# Patient Record
Sex: Male | Born: 1990 | Race: White | Hispanic: No | Marital: Single | Smoking: Never smoker
Health system: Southern US, Community
[De-identification: ages and names within clinical notes are randomized; demographics above are authoritative.]

---
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
## 2015-12-09 ENCOUNTER — Emergency Department (HOSPITAL_COMMUNITY): Payer: Worker's Compensation

## 2015-12-09 ENCOUNTER — Inpatient Hospital Stay (HOSPITAL_COMMUNITY)
Admission: EM | Admit: 2015-12-09 | Discharge: 2015-12-13 | DRG: 958 | Disposition: A | Discharge status: Home / Self Care | Payer: Worker's Compensation | Attending: General Surgery | Admitting: General Surgery

## 2015-12-09 ENCOUNTER — Encounter (HOSPITAL_COMMUNITY): Payer: Self-pay | Admitting: Radiology

## 2015-12-09 ENCOUNTER — Emergency Department (HOSPITAL_COMMUNITY): Payer: Worker's Compensation | Admitting: Anesthesiology

## 2015-12-09 ENCOUNTER — Encounter (HOSPITAL_COMMUNITY): Admission: EM | Disposition: A | Discharge status: Home / Self Care | Payer: Self-pay | Source: Home / Self Care

## 2015-12-09 DIAGNOSIS — T1490XA Injury, unspecified, initial encounter: Secondary | ICD-10-CM

## 2015-12-09 DIAGNOSIS — D62 Acute posthemorrhagic anemia: Secondary | ICD-10-CM | POA: Diagnosis not present

## 2015-12-09 DIAGNOSIS — S45102A Unspecified injury of brachial artery, left side, initial encounter: Principal | ICD-10-CM

## 2015-12-09 DIAGNOSIS — N179 Acute kidney failure, unspecified: Secondary | ICD-10-CM | POA: Diagnosis not present

## 2015-12-09 DIAGNOSIS — W3400XA Accidental discharge from unspecified firearms or gun, initial encounter: Secondary | ICD-10-CM

## 2015-12-09 DIAGNOSIS — S4412XA Injury of median nerve at upper arm level, left arm, initial encounter: Secondary | ICD-10-CM | POA: Diagnosis present

## 2015-12-09 DIAGNOSIS — S45192A Other specified injury of brachial artery, left side, initial encounter: Secondary | ICD-10-CM

## 2015-12-09 DIAGNOSIS — S32302A Unspecified fracture of left ilium, initial encounter for closed fracture: Secondary | ICD-10-CM | POA: Diagnosis present

## 2015-12-09 DIAGNOSIS — R200 Anesthesia of skin: Secondary | ICD-10-CM | POA: Diagnosis present

## 2015-12-09 DIAGNOSIS — S5412XA Injury of median nerve at forearm level, left arm, initial encounter: Secondary | ICD-10-CM | POA: Diagnosis present

## 2015-12-09 DIAGNOSIS — S45392A Other specified injury of superficial vein at shoulder and upper arm level, left arm, initial encounter: Secondary | ICD-10-CM | POA: Diagnosis not present

## 2015-12-09 HISTORY — PX: NERVE EXPLORATION: SHX2082

## 2015-12-09 HISTORY — PX: BYPASS AXILLA/BRACHIAL ARTERY: SHX6426

## 2015-12-09 LAB — COMPREHENSIVE METABOLIC PANEL
ALBUMIN: 4.1 g/dL (ref 3.5–5.0)
ALT: 37 U/L (ref 17–63)
ANION GAP: 17 — AB (ref 5–15)
AST: 52 U/L — AB (ref 15–41)
Alkaline Phosphatase: 68 U/L (ref 38–126)
BUN: 25 mg/dL — AB (ref 6–20)
CHLORIDE: 103 mmol/L (ref 101–111)
CO2: 20 mmol/L — ABNORMAL LOW (ref 22–32)
Calcium: 8.9 mg/dL (ref 8.9–10.3)
Creatinine, Ser: 1.66 mg/dL — ABNORMAL HIGH (ref 0.61–1.24)
GFR calc Af Amer: >60 mL/min (ref >60)
GFR, EST NON AFRICAN AMERICAN: 56 mL/min — AB (ref >60)
Glucose, Bld: 139 mg/dL — ABNORMAL HIGH (ref 65–99)
POTASSIUM: 3.5 mmol/L (ref 3.5–5.1)
Sodium: 140 mmol/L (ref 135–145)
Total Bilirubin: 0.7 mg/dL (ref 0.3–1.2)
Total Protein: 6.3 g/dL — ABNORMAL LOW (ref 6.5–8.1)

## 2015-12-09 LAB — PREPARE FRESH FROZEN PLASMA
UNIT DIVISION: 0
Unit division: 0

## 2015-12-09 LAB — CBC
HEMATOCRIT: 43.5 % (ref 39.0–52.0)
HEMOGLOBIN: 15.4 g/dL (ref 13.0–17.0)
MCH: 30.7 pg (ref 26.0–34.0)
MCHC: 35.4 g/dL (ref 30.0–36.0)
MCV: 86.7 fL (ref 78.0–100.0)
Platelets: 278 10*3/uL (ref 150–400)
RBC: 5.02 MIL/uL (ref 4.22–5.81)
RDW: 12.6 % (ref 11.5–15.5)
WBC: 11.1 10*3/uL — AB (ref 4.0–10.5)

## 2015-12-09 LAB — ETHANOL: Alcohol, Ethyl (B): <5 mg/dL (ref <5)

## 2015-12-09 LAB — PROTIME-INR
INR: 1.19 (ref 0.00–1.49)
PROTHROMBIN TIME: 15.3 s — AB (ref 11.6–15.2)

## 2015-12-09 LAB — CDS SEROLOGY

## 2015-12-09 LAB — ABO/RH: ABO/RH(D): A POS

## 2015-12-09 SURGERY — CREATION, BYPASS, ARTERIAL, AXILLARY TO BRACHIAL
Anesthesia: General | Site: Arm Upper | Laterality: Left

## 2015-12-09 MED ORDER — ROCURONIUM BROMIDE 100 MG/10ML IV SOLN
INTRAVENOUS | Status: DC | PRN
  Administered 2015-12-09: 30 mg via INTRAVENOUS
  Administered 2015-12-09: 20 mg via INTRAVENOUS
  Administered 2015-12-09: 10 mg via INTRAVENOUS
  Administered 2015-12-09: 20 mg via INTRAVENOUS
  Administered 2015-12-09: 10 mg via INTRAVENOUS
  Administered 2015-12-09: 30 mg via INTRAVENOUS
  Administered 2015-12-09: 20 mg via INTRAVENOUS
  Administered 2015-12-09: 10 mg via INTRAVENOUS

## 2015-12-09 MED ORDER — PROTAMINE SULFATE 10 MG/ML IV SOLN
INTRAVENOUS | Status: DC | PRN
  Administered 2015-12-09: 50 mg via INTRAVENOUS

## 2015-12-09 MED ORDER — SUCCINYLCHOLINE CHLORIDE 20 MG/ML IJ SOLN
INTRAMUSCULAR | Status: DC | PRN
Start: 2015-12-09 — End: 2015-12-09
  Administered 2015-12-09: 120 mg via INTRAVENOUS

## 2015-12-09 MED ORDER — SODIUM CHLORIDE 0.9 % IR SOLN
Status: DC | PRN
  Administered 2015-12-09: 1000 mL

## 2015-12-09 MED ORDER — FENTANYL CITRATE (PF) 250 MCG/5ML IJ SOLN
INTRAMUSCULAR | Status: AC
  Filled 2015-12-09: qty 5

## 2015-12-09 MED ORDER — SODIUM CHLORIDE 0.9 % IV SOLN
INTRAVENOUS | Status: DC | PRN
  Administered 2015-12-09: 21:00:00

## 2015-12-09 MED ORDER — IOHEXOL 300 MG/ML  SOLN
100.0000 mL | Freq: Once | INTRAMUSCULAR | Status: AC | PRN
Start: 2015-12-09 — End: 2015-12-09
  Administered 2015-12-09: 100 mL via INTRAVENOUS

## 2015-12-09 MED ORDER — LIDOCAINE HCL (CARDIAC) 20 MG/ML IV SOLN
INTRAVENOUS | Status: DC | PRN
  Administered 2015-12-09: 100 mg via INTRAVENOUS

## 2015-12-09 MED ORDER — DEXTROSE 5 % IV SOLN: 1.5000 g | Freq: Two times a day (BID) | INTRAVENOUS | Status: DC

## 2015-12-09 MED ORDER — HYDROMORPHONE HCL 1 MG/ML IJ SOLN
INTRAMUSCULAR | Status: AC
  Filled 2015-12-09: qty 1

## 2015-12-09 MED ORDER — MIDAZOLAM HCL 5 MG/5ML IJ SOLN
INTRAMUSCULAR | Status: DC | PRN
  Administered 2015-12-09: 2 mg via INTRAVENOUS

## 2015-12-09 MED ORDER — HYDROMORPHONE HCL 1 MG/ML IJ SOLN
0.2500 mg | INTRAMUSCULAR | Status: DC | PRN
  Administered 2015-12-09 – 2015-12-10 (×4): 0.5 mg via INTRAVENOUS

## 2015-12-09 MED ORDER — SUGAMMADEX SODIUM 200 MG/2ML IV SOLN
INTRAVENOUS | Status: DC | PRN
  Administered 2015-12-09: 200 mg via INTRAVENOUS

## 2015-12-09 MED ORDER — DEXAMETHASONE SODIUM PHOSPHATE 10 MG/ML IJ SOLN
INTRAMUSCULAR | Status: DC | PRN
  Administered 2015-12-09: 10 mg via INTRAVENOUS

## 2015-12-09 MED ORDER — PROPOFOL 10 MG/ML IV BOLUS
INTRAVENOUS | Status: AC
  Filled 2015-12-09: qty 20

## 2015-12-09 MED ORDER — SUCCINYLCHOLINE CHLORIDE 20 MG/ML IJ SOLN
INTRAMUSCULAR | Status: AC
  Filled 2015-12-09: qty 1

## 2015-12-09 MED ORDER — DEXAMETHASONE SODIUM PHOSPHATE 10 MG/ML IJ SOLN
INTRAMUSCULAR | Status: AC
  Filled 2015-12-09: qty 1

## 2015-12-09 MED ORDER — FENTANYL CITRATE (PF) 100 MCG/2ML IJ SOLN
INTRAMUSCULAR | Status: DC | PRN
  Administered 2015-12-09: 100 ug via INTRAVENOUS
  Administered 2015-12-09: 150 ug via INTRAVENOUS
  Administered 2015-12-09: 50 ug via INTRAVENOUS
  Administered 2015-12-09: 150 ug via INTRAVENOUS

## 2015-12-09 MED ORDER — SODIUM CHLORIDE 0.9 % IV SOLN
INTRAVENOUS | Status: DC
  Administered 2015-12-09: via INTRAVENOUS

## 2015-12-09 MED ORDER — CEFAZOLIN SODIUM 1-5 GM-% IV SOLN
1.0000 g | Freq: Three times a day (TID) | INTRAVENOUS | Status: DC
  Administered 2015-12-10: 1 g via INTRAVENOUS
  Filled 2015-12-09 (×2): qty 50

## 2015-12-09 MED ORDER — PHENYLEPHRINE HCL 10 MG/ML IJ SOLN
INTRAMUSCULAR | Status: DC | PRN
  Administered 2015-12-09: 40 ug via INTRAVENOUS

## 2015-12-09 MED ORDER — PHENYLEPHRINE HCL 10 MG/ML IJ SOLN
10.0000 mg | INTRAMUSCULAR | Status: DC | PRN
  Administered 2015-12-09: 10 ug/min via INTRAVENOUS

## 2015-12-09 MED ORDER — PROTAMINE SULFATE 10 MG/ML IV SOLN
INTRAVENOUS | Status: AC
  Filled 2015-12-09: qty 5

## 2015-12-09 MED ORDER — PROPOFOL 10 MG/ML IV BOLUS
INTRAVENOUS | Status: DC | PRN
  Administered 2015-12-09: 60 mg via INTRAVENOUS
  Administered 2015-12-09: 140 mg via INTRAVENOUS

## 2015-12-09 MED ORDER — ROCURONIUM BROMIDE 50 MG/5ML IV SOLN
INTRAVENOUS | Status: AC
  Filled 2015-12-09: qty 1

## 2015-12-09 MED ORDER — ONDANSETRON HCL 4 MG/2ML IJ SOLN: 4.0000 mg | Freq: Once | INTRAMUSCULAR | Status: DC | PRN

## 2015-12-09 MED ORDER — ONDANSETRON HCL 4 MG/2ML IJ SOLN
INTRAMUSCULAR | Status: DC | PRN
  Administered 2015-12-09: 4 mg via INTRAVENOUS

## 2015-12-09 MED ORDER — MIDAZOLAM HCL 2 MG/2ML IJ SOLN
INTRAMUSCULAR | Status: AC
  Filled 2015-12-09: qty 2

## 2015-12-09 MED ORDER — FENTANYL CITRATE (PF) 100 MCG/2ML IJ SOLN
INTRAMUSCULAR | Status: AC | PRN
  Administered 2015-12-09: 75 ug via INTRAVENOUS

## 2015-12-09 MED ORDER — SODIUM CHLORIDE 0.9 % IV SOLN
INTRAVENOUS | Status: AC | PRN
  Administered 2015-12-09: 100 mL/h via INTRAVENOUS

## 2015-12-09 MED ORDER — LACTATED RINGERS IV SOLN
INTRAVENOUS | Status: DC | PRN
  Administered 2015-12-09 (×2): via INTRAVENOUS

## 2015-12-09 MED ORDER — CEFAZOLIN SODIUM-DEXTROSE 2-3 GM-% IV SOLR
INTRAVENOUS | Status: DC | PRN
  Administered 2015-12-09: 2 g via INTRAVENOUS

## 2015-12-09 MED ORDER — CEFAZOLIN SODIUM-DEXTROSE 2-3 GM-% IV SOLR
INTRAVENOUS | Status: AC
  Filled 2015-12-09: qty 50

## 2015-12-09 MED ORDER — ARTIFICIAL TEARS OP OINT
TOPICAL_OINTMENT | OPHTHALMIC | Status: DC | PRN
  Administered 2015-12-09: 1 via OPHTHALMIC

## 2015-12-09 MED ORDER — HEPARIN SODIUM (PORCINE) 1000 UNIT/ML IJ SOLN
INTRAMUSCULAR | Status: DC | PRN
  Administered 2015-12-09: 2000 [IU] via INTRAVENOUS
  Administered 2015-12-09: 9000 [IU] via INTRAVENOUS

## 2015-12-09 MED ORDER — THROMBIN 20000 UNITS EX SOLR
CUTANEOUS | Status: AC
  Filled 2015-12-09: qty 20000

## 2015-12-09 MED ORDER — SUGAMMADEX SODIUM 200 MG/2ML IV SOLN
INTRAVENOUS | Status: AC
  Filled 2015-12-09: qty 2

## 2015-12-09 MED ORDER — HEPARIN SODIUM (PORCINE) 1000 UNIT/ML IJ SOLN
INTRAMUSCULAR | Status: AC
  Filled 2015-12-09: qty 3

## 2015-12-09 MED ORDER — MEPERIDINE HCL 25 MG/ML IJ SOLN: 6.2500 mg | INTRAMUSCULAR | Status: DC | PRN

## 2015-12-09 MED ORDER — SODIUM CHLORIDE 0.9 % IV SOLN
INTRAVENOUS | Status: DC | PRN
  Administered 2015-12-09: 20:00:00 via INTRAVENOUS

## 2015-12-09 MED ORDER — LIDOCAINE HCL (CARDIAC) 20 MG/ML IV SOLN
INTRAVENOUS | Status: AC
  Filled 2015-12-09: qty 5

## 2015-12-09 MED ORDER — HEMOSTATIC AGENTS (NO CHARGE) OPTIME
TOPICAL | Status: DC | PRN
  Administered 2015-12-09: 1 via TOPICAL

## 2015-12-09 SURGICAL SUPPLY — 62 items
BANDAGE ELASTIC 4 VELCRO ST LF (GAUZE/BANDAGES/DRESSINGS) ×3 IMPLANT
BNDG ESMARK 4X9 LF (GAUZE/BANDAGES/DRESSINGS) IMPLANT
BNDG GAUZE ELAST 4 BULKY (GAUZE/BANDAGES/DRESSINGS) ×3 IMPLANT
CANISTER SUCTION 2500CC (MISCELLANEOUS) ×3 IMPLANT
CLIP TI MEDIUM 6 (CLIP) ×3 IMPLANT
CLIP TI WIDE RED SMALL 6 (CLIP) ×3 IMPLANT
COVER PROBE W GEL 5X96 (DRAPES) ×3 IMPLANT
CUFF TOURNIQUET SINGLE 18IN (TOURNIQUET CUFF) IMPLANT
CUFF TOURNIQUET SINGLE 24IN (TOURNIQUET CUFF) IMPLANT
DECANTER SPIKE VIAL GLASS SM (MISCELLANEOUS) IMPLANT
DRAIN WOUND SNY 15 RND (WOUND CARE) ×3 IMPLANT
DRAPE INCISE IOBAN 66X45 STRL (DRAPES) ×3 IMPLANT
DRSG PAD ABDOMINAL 8X10 ST (GAUZE/BANDAGES/DRESSINGS) ×3 IMPLANT
ELECT CAUTERY BLADE 6.4 (BLADE) ×3 IMPLANT
ELECT REM PT RETURN 9FT ADLT (ELECTROSURGICAL) ×3
ELECTRODE REM PT RTRN 9FT ADLT (ELECTROSURGICAL) ×1 IMPLANT
EVACUATOR SILICONE 100CC (DRAIN) ×3 IMPLANT
GAUZE SPONGE 4X4 16PLY XRAY LF (GAUZE/BANDAGES/DRESSINGS) ×6 IMPLANT
GAUZE XEROFORM 5X9 LF (GAUZE/BANDAGES/DRESSINGS) ×3 IMPLANT
GLOVE BIO SURGEON STRL SZ 6.5 (GLOVE) ×4 IMPLANT
GLOVE BIO SURGEON STRL SZ7 (GLOVE) ×9 IMPLANT
GLOVE BIO SURGEON STRL SZ7.5 (GLOVE) ×12 IMPLANT
GLOVE BIO SURGEONS STRL SZ 6.5 (GLOVE) ×2
GLOVE BIOGEL PI IND STRL 6.5 (GLOVE) ×3 IMPLANT
GLOVE BIOGEL PI IND STRL 7.5 (GLOVE) ×6 IMPLANT
GLOVE BIOGEL PI IND STRL 8.5 (GLOVE) ×1 IMPLANT
GLOVE BIOGEL PI INDICATOR 6.5 (GLOVE) ×6
GLOVE BIOGEL PI INDICATOR 7.5 (GLOVE) ×12
GLOVE BIOGEL PI INDICATOR 8.5 (GLOVE) ×2
GLOVE ECLIPSE 7.0 STRL STRAW (GLOVE) ×3 IMPLANT
GLOVE SURG ORTHO 8.0 STRL STRW (GLOVE) ×3 IMPLANT
GLOVE SURG SS PI 7.5 STRL IVOR (GLOVE) ×6 IMPLANT
GOWN STRL REUS W/ TWL LRG LVL3 (GOWN DISPOSABLE) ×5 IMPLANT
GOWN STRL REUS W/TWL LRG LVL3 (GOWN DISPOSABLE) ×10
HEMOSTAT SPONGE AVITENE ULTRA (HEMOSTASIS) ×3 IMPLANT
KIT BASIN OR (CUSTOM PROCEDURE TRAY) ×3 IMPLANT
KIT ROOM TURNOVER OR (KITS) ×3 IMPLANT
LIQUID BAND (GAUZE/BANDAGES/DRESSINGS) ×3 IMPLANT
NS IRRIG 1000ML POUR BTL (IV SOLUTION) ×3 IMPLANT
PACK CV ACCESS (CUSTOM PROCEDURE TRAY) ×3 IMPLANT
PACK UNIVERSAL I (CUSTOM PROCEDURE TRAY) ×3 IMPLANT
PAD ARMBOARD 7.5X6 YLW CONV (MISCELLANEOUS) ×6 IMPLANT
PAD CAST 4YDX4 CTTN HI CHSV (CAST SUPPLIES) IMPLANT
PADDING CAST COTTON 4X4 STRL (CAST SUPPLIES)
SPONGE GAUZE 4X4 12PLY STER LF (GAUZE/BANDAGES/DRESSINGS) ×3 IMPLANT
SPONGE INTESTINAL PEANUT (DISPOSABLE) ×3 IMPLANT
SPONGE LAP 18X18 X RAY DECT (DISPOSABLE) ×3 IMPLANT
SPONGE SURGIFOAM ABS GEL 100 (HEMOSTASIS) IMPLANT
STAPLER VISISTAT 35W (STAPLE) ×3 IMPLANT
SUT ETHILON 6 0 P 1 (SUTURE) ×3 IMPLANT
SUT MNCRL AB 4-0 PS2 18 (SUTURE) ×3 IMPLANT
SUT PROLENE 6 0 BV (SUTURE) ×15 IMPLANT
SUT PROLENE 6 0 C 1 24 (SUTURE) ×3 IMPLANT
SUT PROLENE 7 0 BV 1 (SUTURE) ×3 IMPLANT
SUT VIC AB 2-0 CT1 36 (SUTURE) ×9 IMPLANT
SUT VIC AB 3-0 SH 27 (SUTURE) ×10
SUT VIC AB 3-0 SH 27X BRD (SUTURE) ×5 IMPLANT
TOWEL OR 17X24 6PK STRL BLUE (TOWEL DISPOSABLE) ×3 IMPLANT
TUBE CONNECTING 12'X1/4 (SUCTIONS) ×1
TUBE CONNECTING 12X1/4 (SUCTIONS) ×2 IMPLANT
UNDERPAD 30X30 INCONTINENT (UNDERPADS AND DIAPERS) ×3 IMPLANT
WATER STERILE IRR 1000ML POUR (IV SOLUTION) ×3 IMPLANT
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
## 2015-12-09 NOTE — ED Notes (Signed)
Pt transported to CT with Redmond Baseman, RN, taylor, nt and Dr. Lindie Spruce. Will continue to monitor.

## 2015-12-09 NOTE — Anesthesia Procedure Notes (Signed)
Procedure Name: Intubation Date/Time: 12/09/2015 7:56 PM Performed by: Wray Kearns A Pre-anesthesia Checklist: Patient identified, Timeout performed, Emergency Drugs available, Suction available and Patient being monitored Patient Re-evaluated:Patient Re-evaluated prior to inductionOxygen Delivery Method: Circle system utilized Preoxygenation: Pre-oxygenation with 100% oxygen Intubation Type: IV induction, Cricoid Pressure applied and Rapid sequence Laryngoscope Size: Mac and 4 Grade View: Grade I Tube type: Oral Tube size: 7.5 mm Number of attempts: 1 Airway Equipment and Method: Stylet Placement Confirmation: ETT inserted through vocal cords under direct vision,  breath sounds checked- equal and bilateral and positive ETCO2 Secured at: 23 cm Tube secured with: Tape Dental Injury: Teeth and Oropharynx as per pre-operative assessment
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
## 2015-12-09 NOTE — Transfer of Care (Signed)
Immediate Anesthesia Transfer of Care Note  Patient: Robert Bryan  Procedure(s) Performed: Procedure(s): Repair of Brachial artery with interposition graft, ligate brachial vein and basilic vein.  Explore left thigh. (Left)  EXPLORATION of left medial nerve (Left)  Patient Location: PACU  Anesthesia Type:General  Level of Consciousness: awake, alert  and oriented  Airway & Oxygen Therapy: Patient Spontanous Breathing  Post-op Assessment: Report given to RN and Post -op Vital signs reviewed and stable  Post vital signs: Reviewed and stable  Last Vitals:  Filed Vitals:   12/09/15 1908 12/09/15 2326  BP: 128/72 137/78  Pulse:  102  Temp:  36.4 C  Resp: 17 14    Complications: No apparent anesthesia complications

## 2015-12-09 NOTE — Anesthesia Postprocedure Evaluation (Signed)
Anesthesia Post Note  Patient: Robert Bryan  Procedure(s) Performed: Procedure(s) (LRB): Repair of Brachial artery with interposition graft, ligate brachial vein and basilic vein.  Explore left thigh. (Left)  EXPLORATION of left medial nerve (Left)  Patient location during evaluation: PACU Anesthesia Type: General Level of consciousness: awake and alert Pain management: pain level controlled Vital Signs Assessment: post-procedure vital signs reviewed and stable Respiratory status: spontaneous breathing, nonlabored ventilation, respiratory function stable and patient connected to nasal cannula oxygen Cardiovascular status: blood pressure returned to baseline and stable Postop Assessment: no signs of nausea or vomiting Anesthetic complications: no    Last Vitals:  Filed Vitals:   12/09/15 1908 12/09/15 2326  BP: 128/72 137/78  Pulse:  102  Temp:  36.4 C  Resp: 17 14    Last Pain:  Filed Vitals:   12/09/15 2340  PainSc: 8                  Garrie Elenes DAVID

## 2015-12-09 NOTE — Anesthesia Preprocedure Evaluation (Signed)
Anesthesia Evaluation  Patient identified by MRN, date of birth, ID band Patient awake    Reviewed: Allergy & Precautions, NPO status , Patient's Chart, lab work & pertinent test results  Airway Mallampati: I  TM Distance: >3 FB Neck ROM: Full    Dental   Pulmonary    Pulmonary exam normal        Cardiovascular Normal cardiovascular exam     Neuro/Psych    GI/Hepatic   Endo/Other    Renal/GU      Musculoskeletal   Abdominal   Peds  Hematology   Anesthesia Other Findings   Reproductive/Obstetrics                             Anesthesia Physical Anesthesia Plan  ASA: III and emergent  Anesthesia Plan: General   Post-op Pain Management:    Induction: Intravenous, Rapid sequence and Cricoid pressure planned  Airway Management Planned: Oral ETT  Additional Equipment:   Intra-op Plan:   Post-operative Plan: Extubation in OR  Informed Consent: I have reviewed the patients History and Physical, chart, labs and discussed the procedure including the risks, benefits and alternatives for the proposed anesthesia with the patient or authorized representative who has indicated his/her understanding and acceptance.     Plan Discussed with: CRNA and Surgeon  Anesthesia Plan Comments:         Anesthesia Quick Evaluation
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
## 2015-12-09 NOTE — ED Notes (Signed)
Tourniquet removed by Dr. Lindie Spruce.
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
## 2015-12-10 ENCOUNTER — Encounter (HOSPITAL_COMMUNITY): Payer: Self-pay | Admitting: *Deleted

## 2015-12-10 LAB — BASIC METABOLIC PANEL
Anion gap: 7 (ref 5–15)
BUN: 19 mg/dL (ref 6–20)
CALCIUM: 7.8 mg/dL — AB (ref 8.9–10.3)
CHLORIDE: 106 mmol/L (ref 101–111)
CO2: 23 mmol/L (ref 22–32)
CREATININE: 1.38 mg/dL — AB (ref 0.61–1.24)
GFR calc non Af Amer: >60 mL/min (ref >60)
Glucose, Bld: 195 mg/dL — ABNORMAL HIGH (ref 65–99)
Potassium: 4.6 mmol/L (ref 3.5–5.1)
SODIUM: 136 mmol/L (ref 135–145)

## 2015-12-10 LAB — TYPE AND SCREEN
ABO/RH(D): A POS
ANTIBODY SCREEN: NEGATIVE
Unit division: 0
Unit division: 0

## 2015-12-10 LAB — CBC
HCT: 32.9 % — ABNORMAL LOW (ref 39.0–52.0)
Hemoglobin: 11.5 g/dL — ABNORMAL LOW (ref 13.0–17.0)
MCH: 30.2 pg (ref 26.0–34.0)
MCHC: 35 g/dL (ref 30.0–36.0)
MCV: 86.4 fL (ref 78.0–100.0)
PLATELETS: 211 10*3/uL (ref 150–400)
RBC: 3.81 MIL/uL — ABNORMAL LOW (ref 4.22–5.81)
RDW: 12.7 % (ref 11.5–15.5)
WBC: 13.2 10*3/uL — ABNORMAL HIGH (ref 4.0–10.5)

## 2015-12-10 LAB — PROTIME-INR
INR: 1.33 (ref 0.00–1.49)
INR: 1.26 (ref 0.00–1.49)
PROTHROMBIN TIME: 16.6 s — AB (ref 11.6–15.2)
PROTHROMBIN TIME: 15.9 s — AB (ref 11.6–15.2)

## 2015-12-10 LAB — APTT
APTT: 29 s (ref 24–37)
aPTT: 30 seconds (ref 24–37)

## 2015-12-10 LAB — BLOOD PRODUCT ORDER (VERBAL) VERIFICATION

## 2015-12-10 LAB — MRSA PCR SCREENING: MRSA by PCR: NEGATIVE

## 2015-12-10 MED ORDER — ENOXAPARIN SODIUM 40 MG/0.4ML ~~LOC~~ SOLN: 40.0000 mg | SUBCUTANEOUS | Status: DC

## 2015-12-10 MED ORDER — PHENOL 1.4 % MT LIQD: 1.0000 | OROMUCOSAL | Status: DC | PRN

## 2015-12-10 MED ORDER — PANTOPRAZOLE SODIUM 40 MG PO TBEC
40.0000 mg | DELAYED_RELEASE_TABLET | Freq: Every day | ORAL | Status: DC
  Administered 2015-12-10: 40 mg via ORAL
  Filled 2015-12-10: qty 1

## 2015-12-10 MED ORDER — MORPHINE SULFATE (PF) 2 MG/ML IV SOLN: 2.0000 mg | INTRAVENOUS | Status: DC | PRN

## 2015-12-10 MED ORDER — PANTOPRAZOLE SODIUM 40 MG PO TBEC: 40.0000 mg | DELAYED_RELEASE_TABLET | Freq: Every day | ORAL | Status: DC

## 2015-12-10 MED ORDER — ACETAMINOPHEN 325 MG PO TABS: 650.0000 mg | ORAL_TABLET | ORAL | Status: DC | PRN

## 2015-12-10 MED ORDER — HYDROMORPHONE HCL 1 MG/ML IJ SOLN
1.0000 mg | INTRAMUSCULAR | Status: DC | PRN
  Administered 2015-12-10 (×3): 1 mg via INTRAVENOUS
  Administered 2015-12-10: 2 mg via INTRAVENOUS
  Administered 2015-12-10 (×3): 1 mg via INTRAVENOUS
  Administered 2015-12-11 (×2): 2 mg via INTRAVENOUS
  Filled 2015-12-10 (×2): qty 1
  Filled 2015-12-10 (×5): qty 2
  Filled 2015-12-10 (×2): qty 1

## 2015-12-10 MED ORDER — DOCUSATE SODIUM 100 MG PO CAPS
100.0000 mg | ORAL_CAPSULE | Freq: Every day | ORAL | Status: DC
Start: 2015-12-10 — End: 2015-12-12
  Administered 2015-12-10 – 2015-12-12 (×3): 100 mg via ORAL
  Filled 2015-12-10 (×3): qty 1

## 2015-12-10 MED ORDER — ONDANSETRON HCL 4 MG/2ML IJ SOLN: 4.0000 mg | Freq: Four times a day (QID) | INTRAMUSCULAR | Status: DC | PRN

## 2015-12-10 MED ORDER — ACETAMINOPHEN 325 MG PO TABS
325.0000 mg | ORAL_TABLET | ORAL | Status: DC | PRN
  Administered 2015-12-11 – 2015-12-13 (×5): 650 mg via ORAL
  Filled 2015-12-10 (×5): qty 2

## 2015-12-10 MED ORDER — ENOXAPARIN SODIUM 40 MG/0.4ML ~~LOC~~ SOLN
40.0000 mg | SUBCUTANEOUS | Status: DC
Start: 2015-12-11 — End: 2015-12-11

## 2015-12-10 MED ORDER — OXYCODONE-ACETAMINOPHEN 5-325 MG PO TABS: 1.0000 | ORAL_TABLET | ORAL | Status: DC | PRN

## 2015-12-10 MED ORDER — POTASSIUM CHLORIDE CRYS ER 20 MEQ PO TBCR: 20.0000 meq | EXTENDED_RELEASE_TABLET | Freq: Every day | ORAL | Status: DC | PRN

## 2015-12-10 MED ORDER — BISACODYL 5 MG PO TBEC: 5.0000 mg | DELAYED_RELEASE_TABLET | Freq: Every day | ORAL | Status: DC | PRN

## 2015-12-10 MED ORDER — GUAIFENESIN-DM 100-10 MG/5ML PO SYRP: 15.0000 mL | ORAL_SOLUTION | ORAL | Status: DC | PRN

## 2015-12-10 MED ORDER — METOPROLOL TARTRATE 1 MG/ML IV SOLN: 2.0000 mg | INTRAVENOUS | Status: DC | PRN

## 2015-12-10 MED ORDER — CEPHALEXIN 500 MG PO CAPS
500.0000 mg | ORAL_CAPSULE | Freq: Four times a day (QID) | ORAL | Status: DC
  Administered 2015-12-10 – 2015-12-13 (×13): 500 mg via ORAL
  Filled 2015-12-10 (×13): qty 1

## 2015-12-10 MED ORDER — KCL IN DEXTROSE-NACL 20-5-0.45 MEQ/L-%-% IV SOLN
INTRAVENOUS | Status: DC
  Administered 2015-12-10: 02:00:00 via INTRAVENOUS
  Filled 2015-12-10: qty 1000

## 2015-12-10 MED ORDER — PANTOPRAZOLE SODIUM 40 MG IV SOLR: 40.0000 mg | Freq: Every day | INTRAVENOUS | Status: DC

## 2015-12-10 MED ORDER — OXYCODONE HCL 5 MG PO TABS: 5.0000 mg | ORAL_TABLET | ORAL | Status: DC | PRN

## 2015-12-10 MED ORDER — SODIUM CHLORIDE 0.9 % IV SOLN: 500.0000 mL | Freq: Once | INTRAVENOUS | Status: DC | PRN

## 2015-12-10 MED ORDER — MAGNESIUM SULFATE 2 GM/50ML IV SOLN: 2.0000 g | Freq: Every day | INTRAVENOUS | Status: DC | PRN

## 2015-12-10 MED ORDER — ONDANSETRON HCL 4 MG PO TABS: 4.0000 mg | ORAL_TABLET | Freq: Four times a day (QID) | ORAL | Status: DC | PRN

## 2015-12-10 MED ORDER — SENNOSIDES-DOCUSATE SODIUM 8.6-50 MG PO TABS: 1.0000 | ORAL_TABLET | Freq: Every evening | ORAL | Status: DC | PRN

## 2015-12-10 MED ORDER — OXYCODONE HCL 5 MG PO TABS
10.0000 mg | ORAL_TABLET | ORAL | Status: DC | PRN
  Administered 2015-12-10 – 2015-12-11 (×5): 10 mg via ORAL
  Filled 2015-12-10 (×5): qty 2

## 2015-12-10 MED ORDER — LABETALOL HCL 5 MG/ML IV SOLN
10.0000 mg | INTRAVENOUS | Status: DC | PRN
  Filled 2015-12-10: qty 4

## 2015-12-10 MED ORDER — ALUM & MAG HYDROXIDE-SIMETH 200-200-20 MG/5ML PO SUSP: 15.0000 mL | ORAL | Status: DC | PRN

## 2015-12-10 MED ORDER — ACETAMINOPHEN 650 MG RE SUPP: 325.0000 mg | RECTAL | Status: DC | PRN

## 2015-12-10 MED ORDER — HYDRALAZINE HCL 20 MG/ML IJ SOLN: 5.0000 mg | INTRAMUSCULAR | Status: DC | PRN
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
## 2015-12-10 NOTE — Progress Notes (Signed)
Patient admitted from [facility] at 0040 via bed with RN. Patient alert and oriented x4. Left Upper arm compression wrap with JP drain in place. CMS assessed and patient notes numbness but is able to move LUE and L  Radial pulse palpable. See echarting for additional data.

## 2015-12-10 NOTE — Progress Notes (Signed)
Inpatient Diabetes Program Recommendations  AACE/ADA: New Consensus Statement on Inpatient Glycemic Control (2015)  Target Ranges:  Prepandial:   less than 140 mg/dL      Peak postprandial:   less than 180 mg/dL (1-2 hours)      Critically ill patients:  140 - 180 mg/dL   Review of Glycemic Control  Diabetes history: None Outpatient Diabetes medications: None Current orders for Inpatient glycemic control: None  Results for Robert Bryan, Robert Bryan (MRN [id number]) as of 12/10/2015 10:09  Ref. Range 12/09/2015 19:00 12/10/2015 04:27  Glucose Latest Ref Range: 65-99 mg/dL 782 (H) 956 (H)  Hyperglycemia likely d/t steroids. Received 10 mg dexamethasone.  No hx DM in EMR.  Inpatient Diabetes Program Recommendations:    Add Novolog sensitive tidwc if steroids continued.   Thank you. Ailene Ards, RD, LDN, CDE Inpatient Diabetes Coordinator [phone]
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
## 2015-12-10 NOTE — Progress Notes (Addendum)
   Daily Progress Note  Reviewed this patient's case with Dr. Doneen Poisson [facility] Hand Surgeon).  She does perform the nerve transplant procedure needed by this patient.  She does not think this patient requires immediate intervention and can be seen as an outpatient this week or next  - She has clinic on Thursday and has agreed to see the patient [phone]  If unable to make this Thursday, she will see him next Thursday. - Please arrange follow-up appointment prior to patient's discharge.   Leonides Sake, MD [facility] Office: [phone] Pager: [phone]  12/10/2015, 10:06 AM

## 2015-12-11 DIAGNOSIS — S32302A Unspecified fracture of left ilium, initial encounter for closed fracture: Secondary | ICD-10-CM | POA: Diagnosis present

## 2015-12-11 DIAGNOSIS — S5412XA Injury of median nerve at forearm level, left arm, initial encounter: Secondary | ICD-10-CM | POA: Diagnosis present

## 2015-12-11 DIAGNOSIS — D62 Acute posthemorrhagic anemia: Secondary | ICD-10-CM | POA: Diagnosis not present

## 2015-12-11 LAB — CBC
HCT: 25.6 % — ABNORMAL LOW (ref 39.0–52.0)
HEMATOCRIT: 26 % — AB (ref 39.0–52.0)
HEMOGLOBIN: 9 g/dL — AB (ref 13.0–17.0)
Hemoglobin: 9.2 g/dL — ABNORMAL LOW (ref 13.0–17.0)
MCH: 30.5 pg (ref 26.0–34.0)
MCH: 31.8 pg (ref 26.0–34.0)
MCHC: 34.6 g/dL (ref 30.0–36.0)
MCHC: 35.9 g/dL (ref 30.0–36.0)
MCV: 88.1 fL (ref 78.0–100.0)
MCV: 88.6 fL (ref 78.0–100.0)
PLATELETS: 138 10*3/uL — AB (ref 150–400)
Platelets: 145 10*3/uL — ABNORMAL LOW (ref 150–400)
RBC: 2.95 MIL/uL — ABNORMAL LOW (ref 4.22–5.81)
RBC: 2.89 MIL/uL — AB (ref 4.22–5.81)
RDW: 13.1 % (ref 11.5–15.5)
RDW: 13.1 % (ref 11.5–15.5)
WBC: 7.3 10*3/uL (ref 4.0–10.5)
WBC: 7.7 10*3/uL (ref 4.0–10.5)

## 2015-12-11 MED ORDER — OXYCODONE HCL 5 MG PO TABS
10.0000 mg | ORAL_TABLET | ORAL | Status: DC | PRN
  Administered 2015-12-11: 15 mg via ORAL
  Administered 2015-12-11 – 2015-12-12 (×5): 20 mg via ORAL
  Administered 2015-12-12: 15 mg via ORAL
  Administered 2015-12-12 – 2015-12-13 (×3): 20 mg via ORAL
  Filled 2015-12-11: qty 3
  Filled 2015-12-11 (×2): qty 4
  Filled 2015-12-11: qty 3
  Filled 2015-12-11 (×6): qty 4

## 2015-12-11 MED ORDER — POLYETHYLENE GLYCOL 3350 17 G PO PACK
17.0000 g | PACK | Freq: Every day | ORAL | Status: DC
  Filled 2015-12-11 (×2): qty 1

## 2015-12-11 MED ORDER — PREGABALIN 75 MG PO CAPS
75.0000 mg | ORAL_CAPSULE | Freq: Two times a day (BID) | ORAL | Status: DC
  Administered 2015-12-11 – 2015-12-13 (×5): 75 mg via ORAL
  Filled 2015-12-11 (×5): qty 1

## 2015-12-11 MED ORDER — HYDROMORPHONE HCL 1 MG/ML IJ SOLN
0.5000 mg | INTRAMUSCULAR | Status: DC | PRN
  Administered 2015-12-11: 0.5 mg via INTRAVENOUS
  Filled 2015-12-11: qty 1

## 2015-12-11 MED ORDER — NAPROXEN 250 MG PO TABS
500.0000 mg | ORAL_TABLET | Freq: Two times a day (BID) | ORAL | Status: DC
  Administered 2015-12-11 – 2015-12-12 (×3): 500 mg via ORAL
  Filled 2015-12-11 (×3): qty 2

## 2015-12-11 MED ORDER — ENOXAPARIN SODIUM 30 MG/0.3ML ~~LOC~~ SOLN
30.0000 mg | Freq: Two times a day (BID) | SUBCUTANEOUS | Status: DC
  Administered 2015-12-11 – 2015-12-12 (×2): 30 mg via SUBCUTANEOUS
  Filled 2015-12-11 (×2): qty 0.3
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
## 2015-12-11 NOTE — Progress Notes (Signed)
Met with pt and family to discuss dc plans.  Parents at bedside plan to assist at dc.  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  Left message with Aundra Dubin, Director of Risk Mgmt with [place] [phone]) to discuss follow up regarding WC case.    Reinaldo Raddle, RN, BSN  Trauma/Neuro ICU Case Manager [phone]

## 2015-12-11 NOTE — Progress Notes (Signed)
Orthopedic Tech Progress Note Patient Details:  Robert Bryan May 12, 1991 [id number]  Ortho Devices Type of Ortho Device: Crutches Ortho Device/Splint Interventions: Application   Robert Bryan 12/11/2015, 11:52 AM
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
## 2015-12-12 ENCOUNTER — Inpatient Hospital Stay (HOSPITAL_COMMUNITY): Payer: Worker's Compensation

## 2015-12-12 ENCOUNTER — Encounter (HOSPITAL_COMMUNITY): Payer: Self-pay

## 2015-12-12 LAB — CREATININE, SERUM: CREATININE: 1.19 mg/dL (ref 0.61–1.24)

## 2015-12-12 LAB — CBC
HCT: 24.1 % — ABNORMAL LOW (ref 39.0–52.0)
Hemoglobin: 8.2 g/dL — ABNORMAL LOW (ref 13.0–17.0)
MCH: 30.3 pg (ref 26.0–34.0)
MCHC: 34 g/dL (ref 30.0–36.0)
MCV: 88.9 fL (ref 78.0–100.0)
PLATELETS: 126 10*3/uL — AB (ref 150–400)
RBC: 2.71 MIL/uL — ABNORMAL LOW (ref 4.22–5.81)
RDW: 13 % (ref 11.5–15.5)
WBC: 5.2 10*3/uL (ref 4.0–10.5)

## 2015-12-12 MED ORDER — POLYETHYLENE GLYCOL 3350 17 G PO PACK
17.0000 g | PACK | Freq: Two times a day (BID) | ORAL | Status: DC
  Administered 2015-12-12 – 2015-12-13 (×2): 17 g via ORAL
  Filled 2015-12-12 (×2): qty 1

## 2015-12-12 MED ORDER — IOHEXOL 300 MG/ML  SOLN
100.0000 mL | Freq: Once | INTRAMUSCULAR | Status: AC | PRN
Start: 2015-12-12 — End: 2015-12-12
  Administered 2015-12-12: 100 mL via INTRAVENOUS

## 2015-12-12 MED ORDER — DOCUSATE SODIUM 100 MG PO CAPS
100.0000 mg | ORAL_CAPSULE | Freq: Two times a day (BID) | ORAL | Status: DC
  Administered 2015-12-12 – 2015-12-13 (×2): 100 mg via ORAL
  Filled 2015-12-12 (×2): qty 1

## 2015-12-12 MED ORDER — FERROUS SULFATE 325 (65 FE) MG PO TABS
325.0000 mg | ORAL_TABLET | Freq: Two times a day (BID) | ORAL | Status: DC
  Administered 2015-12-12 – 2015-12-13 (×2): 325 mg via ORAL
  Filled 2015-12-12 (×2): qty 1
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
## 2015-12-12 NOTE — Progress Notes (Signed)
Spoke with Santiago Bumpers, Director of Risk Management with [facility] [employer] [address].  Worker's comp claim and adjustor information obtained and faxed to admissions.   Claim # [id number] 3rd Party Administrator: PMA [address] [place], [place] [address]  Adjustor: Redge Gainer [phone]  Ms. Moebs states she will assign a case manager to the case to obtain clinical information, and assist with discharge planning/furture appts.    Will continue to follow.  Quintella Baton, RN, BSN  Trauma/Neuro ICU Case Manager [phone]

## 2015-12-13 MED ORDER — PREGABALIN 75 MG PO CAPS: 75.0000 mg | ORAL_CAPSULE | Freq: Two times a day (BID) | ORAL | Status: DC

## 2015-12-13 MED ORDER — CEPHALEXIN 500 MG PO CAPS: 500.0000 mg | ORAL_CAPSULE | Freq: Four times a day (QID) | ORAL | Status: DC

## 2015-12-13 MED ORDER — OXYCODONE-ACETAMINOPHEN 10-325 MG PO TABS: 1.0000 | ORAL_TABLET | ORAL | Status: DC | PRN

## 2015-12-13 MED ORDER — FERROUS SULFATE 325 (65 FE) MG PO TABS: 325.0000 mg | ORAL_TABLET | Freq: Two times a day (BID) | ORAL | Status: DC

## 2015-12-13 NOTE — Progress Notes (Signed)
PT Cancellation Note  Patient Details Name: Robert StageMatthew XxxSelf MRN: [id number] DOB: September 06, 1991   Cancelled Treatment:    Reason Eval/Treat Not Completed: Patient scheduled to dc home today. Spoke to him and he states he has been getting up in room and even took a shower. He states he feels comfortable with gait and stairs.  No PT session today.   Ileene Allie LUBECK 12/13/2015, 11:19 AM

## 2015-12-13 NOTE — Progress Notes (Signed)
Received call from Wylie HailKathy Hawkins with [facility] Case [employer].  She is pt's case Production designer, theatre/television/filmmanager for his worker's comp case.  Faxed pt discharge summary, per her request, to [phone].    Quintella BatonJulie W. Aidenjames Heckmann, RN, BSN  Trauma/Neuro ICU Case Manager [phone]
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
## 2015-12-13 NOTE — Progress Notes (Signed)
Pt discharged home with his dad. Condition stable. Discharge instructions provided with no concerns expressed

## 2015-12-13 NOTE — Discharge Instructions (Signed)
Keep splint on and dry.  No driving while taking oxycodone.  For other wounds: Wash wounds daily in shower with soap and water. Do not soak. Apply antibiotic ointment (e.g. Neosporin) twice daily and as needed to keep moist. Cover with dry dressing.
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
## 2015-12-13 NOTE — Progress Notes (Signed)
Pt scheduled for discharge home this pm 

## 2015-12-13 NOTE — Progress Notes (Signed)
   Daily Progress Note  Dr. Myra GianottiBrabham will be covering for the Vascular service over the weekend.  Will check back in on patient on Monday if still in the hospital.  Leonides SakeBrian Chen, MD [facility] [place] Office: [phone] Pager: [phone]  12/13/2015, 5:46 AM

## 2015-12-16 ENCOUNTER — Telehealth: Payer: Self-pay | Admitting: Vascular Surgery

## 2015-12-16 NOTE — Telephone Encounter (Signed)
-----   Message from Sharee PimpleMarilyn K McChesney, RN sent at 12/12/2015 10:15 AM EST ----- Regarding: schedule   ----- Message -----    From: Fransisco HertzBrian L Chen, MD    Sent: 12/12/2015   9:50 AM      [address] [facility] [address]  Brantley StageMatthew XxxSelf [id number] 03-Feb-1991  Follow-up: 2 weeks for wound check and staple removal

## 2015-12-16 NOTE — Telephone Encounter (Signed)
Spoke with pts mother to schedule, dpm °

## 2015-12-19 ENCOUNTER — Telehealth: Payer: Self-pay

## 2015-12-19 NOTE — Telephone Encounter (Signed)
Rec'd voice message from nurse case manager.  Pt. is requesting refill on Oxycodone / Acetaminophen for post op pain.  Phone call to pt.  Reported he is taking Oxycodone 10 mg/ Acetaminophen 325 mg, 1 tablet every 4 hrs, during daytime, and 2 tablets every 4 hrs during night.  Reported he is taking 8 tablets/ day.  Stated his left arm throbs when the pain medication has worn off.  Stated the (L) arm swelling has improved.  Stated the incision is intact.  Denied incisional redness or drainage.  Denied fever/ chills.  Pt. Insisted he needed a refill on the pain medication.  Advised he would need to be seen in the office to be considered for a refill on the narcotic pain medication.  Pt. accepted appt. @ 10:15 AM 3/10, with NP.

## 2015-12-20 ENCOUNTER — Encounter: Payer: Self-pay | Admitting: Family

## 2015-12-20 ENCOUNTER — Ambulatory Visit (INDEPENDENT_AMBULATORY_CARE_PROVIDER_SITE_OTHER): Payer: 59 | Admitting: Family

## 2015-12-20 VITALS — BP 139/79 | HR 70 | Temp 97.3°F | Resp 12 | Ht 74.0 in | Wt 213.0 lb

## 2015-12-20 DIAGNOSIS — S45112D Laceration of brachial artery, left side, subsequent encounter: Secondary | ICD-10-CM

## 2015-12-20 MED ORDER — OXYCODONE-ACETAMINOPHEN 10-325 MG PO TABS: 1.0000 | ORAL_TABLET | ORAL | Status: DC | PRN
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
## 2015-12-27 ENCOUNTER — Ambulatory Visit (INDEPENDENT_AMBULATORY_CARE_PROVIDER_SITE_OTHER): Payer: Self-pay | Admitting: Family

## 2015-12-27 ENCOUNTER — Encounter: Payer: Self-pay | Admitting: Vascular Surgery

## 2015-12-27 ENCOUNTER — Telehealth: Payer: Self-pay

## 2015-12-27 ENCOUNTER — Encounter: Payer: Self-pay | Admitting: Family

## 2015-12-27 VITALS — BP 140/82 | HR 74 | Temp 97.0°F | Resp 12 | Ht 74.0 in | Wt 210.0 lb

## 2015-12-27 DIAGNOSIS — T148 Other injury of unspecified body region: Secondary | ICD-10-CM

## 2015-12-27 DIAGNOSIS — M79602 Pain in left arm: Secondary | ICD-10-CM

## 2015-12-27 DIAGNOSIS — S45112D Laceration of brachial artery, left side, subsequent encounter: Secondary | ICD-10-CM

## 2015-12-27 DIAGNOSIS — G8918 Other acute postprocedural pain: Secondary | ICD-10-CM

## 2015-12-27 DIAGNOSIS — W3400XA Accidental discharge from unspecified firearms or gun, initial encounter: Secondary | ICD-10-CM

## 2015-12-27 MED ORDER — OXYCODONE-ACETAMINOPHEN 10-325 MG PO TABS: 1.0000 | ORAL_TABLET | ORAL | Status: DC | PRN
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
## 2015-12-27 NOTE — Telephone Encounter (Signed)
Phone call ret'd to pt.  Reported he is almost out of his pain medication, and is requesting a refill.  Also questioned about need to refill his antibiotic.  Questioned about signs of redness, warmth, drainage at incision; denied any of the above.  Stated he sees a small amt. Of blood, when he removes the bandage.  Reported there is a scab over the incision.  Denied fever/ chills.  Appt. Given for 2:45 PM today to evaluate for pain medic refill.  Agreed w/ plan.   

## 2015-12-31 NOTE — Addendum Note (Signed)
Addended by: Sharee PimpleMCCHESNEY, MARILYN K on: 12/31/2015 12:32 PM   Modules accepted: Orders

## 2016-04-03 ENCOUNTER — Other Ambulatory Visit: Payer: Self-pay | Admitting: Family

## 2016-04-03 ENCOUNTER — Ambulatory Visit (HOSPITAL_COMMUNITY)
Admission: RE | Admit: 2016-04-03 | Discharge: 2016-04-03 | Disposition: A | Discharge status: Home / Self Care | Payer: 59 | Source: Ambulatory Visit | Attending: Vascular Surgery | Admitting: Vascular Surgery

## 2016-04-03 ENCOUNTER — Ambulatory Visit: Payer: 59 | Admitting: Vascular Surgery

## 2016-04-03 DIAGNOSIS — W3400XD Accidental discharge from unspecified firearms or gun, subsequent encounter: Secondary | ICD-10-CM | POA: Insufficient documentation

## 2016-04-03 DIAGNOSIS — Z9889 Other specified postprocedural states: Secondary | ICD-10-CM | POA: Diagnosis not present

## 2016-04-03 DIAGNOSIS — W3400XA Accidental discharge from unspecified firearms or gun, initial encounter: Secondary | ICD-10-CM

## 2016-04-03 DIAGNOSIS — T148 Other injury of unspecified body region: Secondary | ICD-10-CM | POA: Diagnosis not present

## 2016-04-10 ENCOUNTER — Encounter: Payer: Self-pay | Admitting: Vascular Surgery

## 2016-04-10 ENCOUNTER — Ambulatory Visit (INDEPENDENT_AMBULATORY_CARE_PROVIDER_SITE_OTHER): Payer: Worker's Compensation | Admitting: Vascular Surgery

## 2016-04-10 VITALS — BP 123/68 | HR 66 | Temp 97.6°F | Resp 14 | Ht 74.0 in | Wt 230.0 lb

## 2016-04-10 DIAGNOSIS — W3400XA Accidental discharge from unspecified firearms or gun, initial encounter: Secondary | ICD-10-CM

## 2016-04-10 DIAGNOSIS — T148 Other injury of unspecified body region: Secondary | ICD-10-CM

## 2016-04-10 DIAGNOSIS — S45102D Unspecified injury of brachial artery, left side, subsequent encounter: Secondary | ICD-10-CM

## 2016-04-10 DIAGNOSIS — S5412XD Injury of median nerve at forearm level, left arm, subsequent encounter: Secondary | ICD-10-CM

## 2016-04-10 NOTE — Progress Notes (Addendum)
    Established Previous Bypass   History of Present Illness  Robert Bryan is a 25 y.o. (Mar 01, 1991) male who presents with chief complaint: routine follow-up.  Previous operation(s) include:   1.  Repair of left brachial artery with interposition vein graft (basilic vein), ligation of left brachial and basilic vein (12/10/15)  This patient was referred to [facility] to reconstruction of his median nerve.  He has had subsequent surgery to reconstruct that nerve.  His making progress and regains 3/5 finger use in his hand grip.  The patient continues with rehab.  The patient's PMH, PSH, SH, and FamHx are unchanged from 12/27/15.  Current Outpatient Prescriptions  Medication Sig Dispense Refill  . LYRICA 150 MG capsule Take 2 tablets by mouth 2 (two) times daily before lunch and supper.  3   No current facility-administered medications for this visit.    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  General: A&O x 3, WDWN  Vascular: Vessel Right Left  Radial Palpable Palpable  Brachial Palpable Palpable   Musculoskeletal: M/S 5/5 throughout except LUE, LUE: some fasciculation with upper arm testing, decreased thumb and 1st digit apposition, incision well healed, swelling minimal in LUE  Neurologic: Pain and light touch intact in extremities except mild decrease in hand, Motor exam as listed above   Bypass Duplex (Date: 04/10/2016)  Proximal to bypass: 135 c/s  With-in bypass: 45-119 c/s  Distal to bypass: 119 c/s   Medical Decision Making  Robert Bryan is a 25 y.o. male who presents with: s/p  Repair of left brachial artery with interposition vein graft (basilic vein),  ligation of left brachial and basilic vein, s/p L median nerve grafting ([facility]) .  Pt has had an excellent response to the median nerve grafting.  Hopefully he regains full use of his L arm.    The interposition graft also appears to be functioning without difficulty at this point.  Longitudinal surveillance will be essential in his case, as a brachial artery was reconstructed.  The venous drainage in the left arm also appears to have appropriately rerouted as the swelling is greatly improved.  Based on the patient's vascular studies and examination, I have offered the patient: LUE arterial duplex q month  I discussed in depth with the patient the nature of atherosclerosis, and emphasized the importance of maximal medical management including strict control of blood pressure, blood glucose, and lipid levels, obtaining regular exercise, and cessation of smoking.    The patient is aware that without maximal medical management the underlying atherosclerotic disease process will progress, limiting the benefit of any interventions.  I discussed in depth with the patient the need for long term surveillance to improve the primary assisted patency of his bypass.  The patient agrees to cooperate with such.  Thank you for allowing us to participate in this patient's care.   Leonides SakeBrian Cynthea Zachman, MD, FACS [facility] [place] Office: [phone] Pager: [phone]
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
## 2016-10-16 ENCOUNTER — Ambulatory Visit: Payer: Self-pay | Admitting: Vascular Surgery

## 2016-10-16 ENCOUNTER — Other Ambulatory Visit (HOSPITAL_COMMUNITY): Payer: Self-pay

## 2016-11-13 ENCOUNTER — Encounter: Payer: Self-pay | Admitting: Vascular Surgery

## 2016-11-18 ENCOUNTER — Other Ambulatory Visit: Payer: Self-pay | Admitting: *Deleted

## 2016-11-18 DIAGNOSIS — W3400XA Accidental discharge from unspecified firearms or gun, initial encounter: Secondary | ICD-10-CM

## 2016-11-18 DIAGNOSIS — Z48812 Encounter for surgical aftercare following surgery on the circulatory system: Secondary | ICD-10-CM

## 2016-11-19 ENCOUNTER — Ambulatory Visit (HOSPITAL_COMMUNITY)
Admission: RE | Admit: 2016-11-19 | Discharge: 2016-11-19 | Disposition: A | Discharge status: Home / Self Care | Payer: Worker's Compensation | Source: Ambulatory Visit | Attending: Vascular Surgery | Admitting: Vascular Surgery

## 2016-11-19 DIAGNOSIS — X58XXXA Exposure to other specified factors, initial encounter: Secondary | ICD-10-CM | POA: Insufficient documentation

## 2016-11-19 DIAGNOSIS — W3400XA Accidental discharge from unspecified firearms or gun, initial encounter: Secondary | ICD-10-CM | POA: Insufficient documentation

## 2016-11-19 DIAGNOSIS — Z48812 Encounter for surgical aftercare following surgery on the circulatory system: Secondary | ICD-10-CM | POA: Insufficient documentation

## 2016-11-20 ENCOUNTER — Encounter: Payer: Self-pay | Admitting: Vascular Surgery

## 2016-11-20 ENCOUNTER — Ambulatory Visit: Payer: 59

## 2016-11-20 ENCOUNTER — Ambulatory Visit (HOSPITAL_COMMUNITY): Payer: 59

## 2016-11-23 ENCOUNTER — Encounter: Payer: Self-pay | Admitting: Vascular Surgery

## 2016-11-30 ENCOUNTER — Ambulatory Visit (INDEPENDENT_AMBULATORY_CARE_PROVIDER_SITE_OTHER): Payer: Worker's Compensation | Admitting: Vascular Surgery

## 2016-11-30 ENCOUNTER — Encounter: Payer: Self-pay | Admitting: Vascular Surgery

## 2016-11-30 VITALS — BP 128/77 | HR 63 | Resp 18 | Ht 74.0 in | Wt 222.9 lb

## 2016-11-30 DIAGNOSIS — S45102D Unspecified injury of brachial artery, left side, subsequent encounter: Secondary | ICD-10-CM

## 2016-11-30 DIAGNOSIS — S4412XD Injury of median nerve at upper arm level, left arm, subsequent encounter: Secondary | ICD-10-CM

## 2016-11-30 NOTE — Progress Notes (Signed)
    Established Previous Bypass   History of Present Illness  Robert Bryan is a 26 y.o. ([birth year]) male who presents with chief complaint: routine follow-up.  Previous operation(s) include:   1.  Repair of left brachial artery with interposition vein graft (basilic vein), ligation of left brachial and basilic vein (12/10/15)  This patient was referred to [facility] to reconstruction of his median nerve.  He has had subsequent surgery to reconstruct that nerve.  His making progress and has regained 4/5 finger use in his hand grip.  He is going to be schedule for an additional procedure to help with thumb apposition.  The patient's PMH, PSH, SH, and FamHx are unchanged from 04/10/16.  Current Outpatient Prescriptions  Medication Sig Dispense Refill  . LYRICA 150 MG capsule Take 2 tablets by mouth 2 (two) times daily before lunch and supper.  3   No current facility-administered medications for this visit.    On ROS today: sensation in finger tip still decreased, occasional radicular pain in L arm but improved   Physical Examination  Vitals:   11/30/16 1335 11/30/16 1337  BP: (!) 148/64 128/77  Pulse: 63   Resp: 18   SpO2: 99%   Weight: 222 lb 14.4 oz (101.1 kg)   Height: 6\' 2"  (1.88 m)     Body mass index is 28.62 kg/m.  General: A&O x 3, WDWN  Vascular: Vessel Right Left  Radial Palpable Palpable  Brachial Palpable Palpable   Musculoskeletal: Incision healed, visibly pulsatile bypass graft, palpable graft pulse throughout, sensation still spotty in finger tips, hand grip 4-5/5 with poor thumb apposition still  Neurologic: Pain and light touch intact in extremities except mild decrease in hand, Motor exam as listed above  Bypass Duplex (Date: 11/19/16)  Proximal to bypass: 111 c/s  With-in bypass: 33-95 c/s  Distal to bypass: 111 c/s  No signs of hyperplasia or stenosis   Medical Decision Making  Robert Bryan is a 26 y.o. ([birth year])  male who presents with: s/p  Repair of left brachial artery with interposition vein graft (basilic vein), ligation of left brachial and basilic vein, s/p L median nerve grafting ([facility]) .  Strong graft pulse throughout without obvious evidence of neointimal hyperplasia or stenosis at this point.  Based on the patient's vascular studies and examination, I have offered the patient: LUE arterial duplex q6 month  I discussed in depth with the patient the nature of atherosclerosis, and emphasized the importance of maximal medical management including strict control of blood pressure, blood glucose, and lipid levels, obtaining regular exercise, and cessation of smoking.    I discussed in depth with the patient the need for long term surveillance to improve the primary assisted patency of his bypass.  The patient agrees to cooperate with such.  Thank you for allowing us to participate in this patient's care.   Robert SakeBrian Keshana Klemz, MD, FACS [facility] [place] Office: [phone] Pager: [phone]

## 2016-12-03 NOTE — Addendum Note (Signed)
Addended by: Burton ApleyPETTY, Dondra Rhett A on: 12/03/2016 04:38 PM   Modules accepted: Orders

## 2017-06-09 ENCOUNTER — Encounter: Payer: Self-pay | Admitting: Vascular Surgery

## 2017-06-15 NOTE — Progress Notes (Deleted)
Established Previous Bypass   History of Present Illness   Robert Bryan is a 26 y.o. (10/20/1990) male who presents with chief complaint: ***.  Previous operation(s) include:  1. Repair of left brachial artery with interposition vein graft (basilic vein), ligation of left brachial and basilic vein (12/10/15)  This patient was referred to [facility] to reconstruction of his median nerve. He has had subsequent surgery to reconstruct that nerve. His making progress and has regained 4/5 finger use in his hand grip. ***He is going to be schedule for an additional procedure to help with thumb apposition.  The patient's symptoms have *** progressed.  The patient's symptoms are: ***. The patient's treatment regimen currently included: maximal medical management ***and ***.  The patient's PMH, PSH, SH, and FamHx are unchanged from 11/30/16.  Current Outpatient Prescriptions  Medication Sig Dispense Refill  . LYRICA 150 MG capsule Take 2 tablets by mouth 2 (two) times daily before lunch and supper.  3   No current facility-administered medications for this visit.     On ROS today: ***, ***   Physical Examination  ***There were no vitals filed for this visit. ***There is no height or weight on file to calculate BMI.  General {LOC:19197::"Somulent","Alert"}, {Orientation:19197::"Confused","O x 3"}, {Weight:19197::"Obese","Cachectic","WD"}, {General state of health:19197::"Ill appearing","Elderly","NAD"}  Pulmonary {Chest wall:19197::"Asx chest movement","Sym exp"}, {Air movt:19197::"Decreased *** air movt","good B air movt"}, {BS:19197::"rales on ***","rhonchi on ***","wheezing on ***","CTA B"}  Cardiac {Rhythm:19197::"Irregularly, irregular rate and rhythm","RRR, Nl S1, S2"}, {Murmur:19197::"Murmur present: ***","no Murmurs"}, {Rubs:19197::"Rub present: ***","No rubs"}, {Gallop:19197::"Gallop present: ***","No S3,S4"}  Vascular Vessel Right Left  Radial {Palpable:19197::"Not  palpable","Faintly palpable","Palpable"} {Palpable:19197::"Not palpable","Faintly palpable","Palpable"}  Brachial {Palpable:19197::"Not palpable","Faintly palpable","Palpable"} {Palpable:19197::"Not palpable","Faintly palpable","Palpable"}  Carotid Palpable, {Bruit:19197::"Bruit present","No Bruit"} Palpable, {Bruit:19197::"Bruit present","No Bruit"}  Aorta Not palpable N/A  Femoral {Palpable:19197::"Not palpable","Faintly palpable","Palpable"} {Palpable:19197::"Not palpable","Faintly palpable","Palpable"}  Popliteal {Palpable:19197::"Prominently palpable","Not palpable"} {Palpable:19197::"Prominently palpable","Not palpable"}  PT {Palpable:19197::"Not palpable","Faintly palpable","Palpable"} {Palpable:19197::"Not palpable","Faintly palpable","Palpable"}  DP {Palpable:19197::"Not palpable","Faintly palpable","Palpable"} {Palpable:19197::"Not palpable","Faintly palpable","Palpable"}    Gastro- intestinal soft, {Distension:19197::"distended","non-distended"}, {TTP:19197::"TTP in *** quadrant","appropriate tenderness to palpation","non-tender to palpation"}, {Guarding:19197::"Guarding and rebound in *** quadrant","No guarding or rebound"}, {HSM:19197::"HSM present","no HSM"}, {Masses:19197::"Mass present: ***","no masses"}, {Flank:19197::"CVAT on ***","Flank bruit present on ***","no CVAT B"}, {AAA:19197::"Palpable prominent aortic pulse","No palpable prominent aortic pulse"}, {Surgical incision:19197::"Surg incisions: ***","Surgical incisions well healed"," "}  Musculo- skeletal M/S 5/5 throughout {MS:19197::"except ***"," "}, Extremities without ischemic changes {MS:19197::"except ***"," "}, {Edema:19197::"Pitting edema present: ***","Non-pitting edema present: ***","No edema present"}, {Varicosities:19197::"Varicosities present: ***","No visible varicosities "}, {LDS:19197::"Lipodermatosclerosis present: ***","No Lipodermatosclerosis present"}  Neurologic Pain and light touch intact in  extremities{CN:19197::" except for decreased sensation in ***"," "}, Motor exam as listed above    Non-Invasive Vascular Imaging ABI (***)  R:   ABI: *** (***),   PT: {Signals:19197::"none","mono","bi","tri"}  DP: {Signals:19197::"none","mono","bi","tri"}  TBI:  ***  L:   ABI: *** (***),   PT: {Signals:19197::"none","mono","bi","tri"}  DP: {Signals:19197::"none","mono","bi","tri"}  TBI: ***  Aortoiliac Duplex (***)  Ao: *** c/s  R iliac: *** c/s  L iliac: *** c/s  Bypass Duplex (***)  Proximal to bypass: *** c/s  With-in bypass: *** c/s  Distal to bypass: *** c/s   Medical Decision Making   Robert Bryan is a 26 y.o. male who presents with: s/p Repair of left brachial artery with interposition vein graft (basilic vein), ligation of left brachial and basilic vein, s/p L median nerve grafting ([facility]).   Based on the patient's vascular studies and examination, I have offered the patient: LUE arterial duplex q6 month.  I discussed in depth with the patient the nature of atherosclerosis, and emphasized the importance of maximal medical management including strict control of blood pressure, blood glucose, and lipid levels, obtaining regular exercise, and cessation of smoking.    The patient is aware that without maximal medical management the underlying atherosclerotic disease process will progress, limiting the benefit of any interventions.  I discussed in depth with the patient the need for long term surveillance to improve the primary assisted patency of his bypass.  The patient agrees to cooperate with such. The patient is currently not on on statin as not medically indicated.  The patient is currently not on anti-platelet due to occupational demands that may lead to bleeding.  Thank you for allowing us to participate in this patient's care.   Leonides SakeBrian Anairis Knick, MD, FACS [facility] [place] Office: [phone] Pager:  [phone]

## 2017-06-18 ENCOUNTER — Encounter: Payer: Self-pay | Admitting: Vascular Surgery

## 2017-06-18 ENCOUNTER — Ambulatory Visit (HOSPITAL_COMMUNITY)
Admission: RE | Admit: 2017-06-18 | Discharge: 2017-06-18 | Disposition: A | Discharge status: Home / Self Care | Payer: 59 | Source: Ambulatory Visit | Attending: Vascular Surgery | Admitting: Vascular Surgery

## 2017-06-18 ENCOUNTER — Other Ambulatory Visit (HOSPITAL_COMMUNITY): Payer: Self-pay

## 2017-06-18 ENCOUNTER — Ambulatory Visit (INDEPENDENT_AMBULATORY_CARE_PROVIDER_SITE_OTHER): Payer: Worker's Compensation | Admitting: Vascular Surgery

## 2017-06-18 ENCOUNTER — Ambulatory Visit: Payer: Self-pay | Admitting: Vascular Surgery

## 2017-06-18 VITALS — BP 118/72 | HR 73 | Ht 74.0 in | Wt 212.0 lb

## 2017-06-18 DIAGNOSIS — S4412XD Injury of median nerve at upper arm level, left arm, subsequent encounter: Secondary | ICD-10-CM | POA: Insufficient documentation

## 2017-06-18 DIAGNOSIS — W3400XA Accidental discharge from unspecified firearms or gun, initial encounter: Secondary | ICD-10-CM

## 2017-06-18 DIAGNOSIS — X58XXXD Exposure to other specified factors, subsequent encounter: Secondary | ICD-10-CM | POA: Insufficient documentation

## 2017-06-18 DIAGNOSIS — S45102D Unspecified injury of brachial artery, left side, subsequent encounter: Secondary | ICD-10-CM | POA: Diagnosis not present
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
## 2017-06-25 NOTE — Addendum Note (Signed)
Addended by: Burton Apley A on: 06/25/2017 04:10 PM   Modules accepted: Orders

## 2017-12-20 ENCOUNTER — Other Ambulatory Visit: Payer: Self-pay

## 2017-12-20 DIAGNOSIS — S45102D Unspecified injury of brachial artery, left side, subsequent encounter: Secondary | ICD-10-CM
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
## 2017-12-22 ENCOUNTER — Ambulatory Visit (INDEPENDENT_AMBULATORY_CARE_PROVIDER_SITE_OTHER): Payer: Worker's Compensation | Admitting: Vascular Surgery

## 2017-12-22 ENCOUNTER — Ambulatory Visit (HOSPITAL_COMMUNITY)
Admission: RE | Admit: 2017-12-22 | Discharge: 2017-12-22 | Disposition: A | Discharge status: Home / Self Care | Payer: Worker's Compensation | Source: Ambulatory Visit | Attending: Vascular Surgery | Admitting: Vascular Surgery

## 2017-12-22 ENCOUNTER — Encounter: Payer: Self-pay | Admitting: Vascular Surgery

## 2017-12-22 VITALS — BP 131/75 | HR 65 | Resp 18 | Ht 74.0 in | Wt 221.0 lb

## 2017-12-22 DIAGNOSIS — X58XXXD Exposure to other specified factors, subsequent encounter: Secondary | ICD-10-CM | POA: Insufficient documentation

## 2017-12-22 DIAGNOSIS — W3400XA Accidental discharge from unspecified firearms or gun, initial encounter: Secondary | ICD-10-CM

## 2017-12-22 DIAGNOSIS — S45102D Unspecified injury of brachial artery, left side, subsequent encounter: Secondary | ICD-10-CM

## 2017-12-22 DIAGNOSIS — S4412XD Injury of median nerve at upper arm level, left arm, subsequent encounter: Secondary | ICD-10-CM

## 2017-12-23 ENCOUNTER — Other Ambulatory Visit: Payer: Self-pay

## 2017-12-23 DIAGNOSIS — S45102D Unspecified injury of brachial artery, left side, subsequent encounter: Secondary | ICD-10-CM

## 2017-12-23 DIAGNOSIS — S4412XD Injury of median nerve at upper arm level, left arm, subsequent encounter: Secondary | ICD-10-CM

## 2017-12-24 ENCOUNTER — Ambulatory Visit: Payer: Self-pay | Admitting: Vascular Surgery

## 2017-12-24 ENCOUNTER — Encounter (HOSPITAL_COMMUNITY): Payer: Self-pay

## 2018-06-29 ENCOUNTER — Ambulatory Visit: Payer: Self-pay | Admitting: Vascular Surgery

## 2018-06-29 ENCOUNTER — Other Ambulatory Visit (HOSPITAL_COMMUNITY): Payer: Self-pay

## 2018-07-26 ENCOUNTER — Other Ambulatory Visit (HOSPITAL_COMMUNITY): Payer: Self-pay

## 2018-07-26 ENCOUNTER — Ambulatory Visit: Payer: 59 | Admitting: Vascular Surgery

## 2020-05-21 DIAGNOSIS — Y939 Activity, unspecified: Secondary | ICD-10-CM | POA: Insufficient documentation

## 2020-05-21 DIAGNOSIS — Y998 Other external cause status: Secondary | ICD-10-CM | POA: Diagnosis not present

## 2020-05-21 DIAGNOSIS — Y92009 Unspecified place in unspecified non-institutional (private) residence as the place of occurrence of the external cause: Secondary | ICD-10-CM | POA: Insufficient documentation

## 2020-05-21 DIAGNOSIS — W108XXA Fall (on) (from) other stairs and steps, initial encounter: Secondary | ICD-10-CM | POA: Diagnosis not present

## 2020-05-21 DIAGNOSIS — S0993XA Unspecified injury of face, initial encounter: Secondary | ICD-10-CM | POA: Diagnosis present

## 2020-05-21 DIAGNOSIS — S0181XA Laceration without foreign body of other part of head, initial encounter: Secondary | ICD-10-CM | POA: Insufficient documentation

## 2020-05-22 ENCOUNTER — Emergency Department (HOSPITAL_COMMUNITY)
Admission: EM | Admit: 2020-05-22 | Discharge: 2020-05-22 | Disposition: A | Discharge status: Home / Self Care | Payer: BC Managed Care – PPO | Attending: Emergency Medicine | Admitting: Emergency Medicine

## 2020-05-22 ENCOUNTER — Other Ambulatory Visit: Payer: Self-pay

## 2020-05-22 ENCOUNTER — Encounter (HOSPITAL_COMMUNITY): Payer: Self-pay

## 2020-05-22 DIAGNOSIS — S0181XA Laceration without foreign body of other part of head, initial encounter: Secondary | ICD-10-CM

## 2020-05-22 DIAGNOSIS — W109XXA Fall (on) (from) unspecified stairs and steps, initial encounter: Secondary | ICD-10-CM

## 2020-05-22 MED ORDER — LIDOCAINE HCL (PF) 1 % IJ SOLN
5.0000 mL | Freq: Once | INTRAMUSCULAR | Status: AC
  Administered 2020-05-22: 01:00:00 5 mL
  Filled 2020-05-22: qty 30

## 2020-05-22 NOTE — ED Triage Notes (Signed)
Patient arrived stating he tripped prior to arrival with a one inch laceration to his chin. No LOC.

## 2020-05-22 NOTE — ED Notes (Signed)
Laceration irrigated with sterile water and betadine

## 2020-05-22 NOTE — ED Provider Notes (Signed)
[facility]-EMERGENCY DEPT Provider Note: Robert Dell, MD, FACEP  CSN: [id number] MRN: [id number] ARRIVAL: 05/21/20 at 2355 ROOM: [facility]   CHIEF COMPLAINT  Facial Laceration   HISTORY OF PRESENT ILLNESS  05/22/20 12:16 AM Robert Bryan is a 29 y.o. male who tripped on his stairs at home just prior to arrival. He struck his chin, where he has a laceration. Associated pain is mild to moderate, bleeding is controlled, and tetanus is up to date. He denies LOC, neck pain, difficultly breathing or difficulty swallowing. Tetanus immunization is up to date.    History reviewed. No pertinent past medical history.  Past Surgical History:  Procedure Laterality Date  . BYPASS AXILLA/BRACHIAL ARTERY Left 12/09/2015   Procedure: Repair of Brachial artery with interposition graft, ligate brachial vein and basilic vein.  Explore left thigh.;  Surgeon: Fransisco Hertz, MD;  Location: [facility] OR;  Service: Vascular;  Laterality: Left;  . NERVE EXPLORATION Left 12/09/2015   Procedure:  EXPLORATION of left medial nerve;  Surgeon: Fransisco Hertz, MD;  Location: [facility] OR;  Service: Vascular;  Laterality: Left;    No family history on file.  Social History   Tobacco Use  . Smoking status: Never Smoker  . Smokeless tobacco: Never Used  Vaping Use  . Vaping Use: Never used  Substance Use Topics  . Alcohol use: No    Alcohol/week: 0.0 standard drinks  . Drug use: No    Prior to Admission medications   Not on File    Allergies Patient has no known allergies.   REVIEW OF SYSTEMS  Negative except as noted here or in the History of Present Illness.   PHYSICAL EXAMINATION  Initial Vital Signs Blood pressure (!) 156/78, pulse 63, temperature 98 F (36.7 C), temperature source Oral, resp. rate 15, height 6\' 2"  (1.88 m), weight 97.5 kg, SpO2 100 %.  Examination General: Well-developed, well-nourished male in no acute distress; appearance consistent with age of record HENT: normocephalic; laceration to  chin:    Eyes: pupils equal, round and reactive to light; extraocular muscles intact Neck: supple; nontender Heart: regular rate and rhythm Lungs: clear to auscultation bilaterally Abdomen: soft; nondistended; nontender; bowel sounds present Extremities: No deformity; full range of motion Neurologic: Awake, alert and oriented; motor function intact in all extremities and symmetric; no facial droop Skin: Warm and dry Psychiatric: Normal mood and affect   RESULTS  Summary of this visit's results, reviewed and interpreted by myself:   EKG Interpretation  Date/Time:    Ventricular Rate:    PR Interval:    QRS Duration:   QT Interval:    QTC Calculation:   R Axis:     Text Interpretation:        Laboratory Studies: No results found for this or any previous visit (from the past 24 hour(s)). Imaging Studies: No results found.  ED COURSE and MDM  Nursing notes, initial and subsequent vitals signs, including pulse oximetry, reviewed and interpreted by myself.  Vitals:   05/22/20 0003  BP: (!) 156/78  Pulse: 63  Resp: 15  Temp: 98 F (36.7 C)  TempSrc: Oral  SpO2: 100%  Weight: 97.5 kg  Height: 6\' 2"  (1.88 m)   Medications  lidocaine (PF) (XYLOCAINE) 1 % injection 5 mL (has no administration in time range)    Britni Henderly, PA-C, to repair wound.  PROCEDURES  Procedures   ED DIAGNOSES     ICD-10-CM   1. Chin laceration, initial encounter  S01.81XA  2. Fall on stairs, initial encounter  W10.Gasper Sells, MD 05/22/20 [phone]

## 2020-05-22 NOTE — ED Provider Notes (Signed)
LACERATION REPAIR Performed by: Linwood Dibbles Authorized by: Linwood Dibbles Consent: Verbal consent obtained. Risks and benefits: risks, benefits and alternatives were discussed Consent given by: patient Patient identity confirmed: provided demographic data Prepped and Draped in normal sterile fashion Wound explored  Laceration Location: chin  Laceration Length: 2 cm  No Foreign Bodies seen or palpated  Anesthesia: local infiltration  Local anesthetic: lidocaine 2% WO epinephrine  Anesthetic total: 3 ml  Irrigation method: syringe Amount of cleaning: standard  Skin closure: 5-0 Prolene Number of sutures: 5  Technique: simple interrupted   Patient tolerance: Patient tolerated the procedure well with no immediate complications.   Alexxa Sabet A, PA-C 05/22/20 0109    Molpus, Jonny Ruiz, MD 05/22/20 [phone]

## 2022-09-13 ENCOUNTER — Other Ambulatory Visit: Payer: Self-pay

## 2022-09-13 ENCOUNTER — Ambulatory Visit
Admission: EM | Admit: 2022-09-13 | Discharge: 2022-09-13 | Disposition: A | Discharge status: Home / Self Care | Payer: BC Managed Care – PPO | Attending: Family Medicine | Admitting: Family Medicine

## 2022-09-13 DIAGNOSIS — L03213 Periorbital cellulitis: Secondary | ICD-10-CM

## 2022-09-13 MED ORDER — SULFACETAMIDE SODIUM 10 % OP SOLN: 1.0000 [drp] | OPHTHALMIC | 0 refills | Status: AC

## 2022-09-13 MED ORDER — AMOXICILLIN-POT CLAVULANATE 875-125 MG PO TABS: ORAL_TABLET | ORAL | 0 refills | Status: AC

## 2022-09-13 NOTE — ED Triage Notes (Signed)
Right eye drainage and swelling onset since last night.
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
## 2022-09-13 NOTE — Discharge Instructions (Signed)
To help relieve discomfort, place a clean washcloth that is wet with warm water over your eye. Leave the washcloth on for a few minutes, then remove it. ° °If symptoms become significantly worse during the night or over the weekend, proceed to the local emergency room.  °
